# Patient Record
Sex: Female | Born: 1955 | Race: White | Hispanic: No | Marital: Single
Health system: Southern US, Community
[De-identification: ages and names within clinical notes are randomized; demographics above are authoritative.]

---

## 2009-05-21 ENCOUNTER — Inpatient Hospital Stay: Payer: Self-pay | Admitting: Internal Medicine

## 2010-07-10 ENCOUNTER — Observation Stay: Payer: Self-pay | Admitting: Internal Medicine

## 2011-04-28 IMAGING — CR DG CHEST 2V
1 series · 2 of 2 positions shown · non-contrast
Comparison: none

REASON FOR EXAM: SOB
COMMENTS:

PROCEDURE:     DXR - DXR CHEST PA (OR AP) AND LATERAL  - May 20, 2009  [DATE]
RESULT:     The lungs are clear. The cardiac silhouette and visualized bony
skeleton are unremarkable.

[Series 1: view not recorded · 0.17mm/px · 2 of 2 slices shown]
[im 1/2]
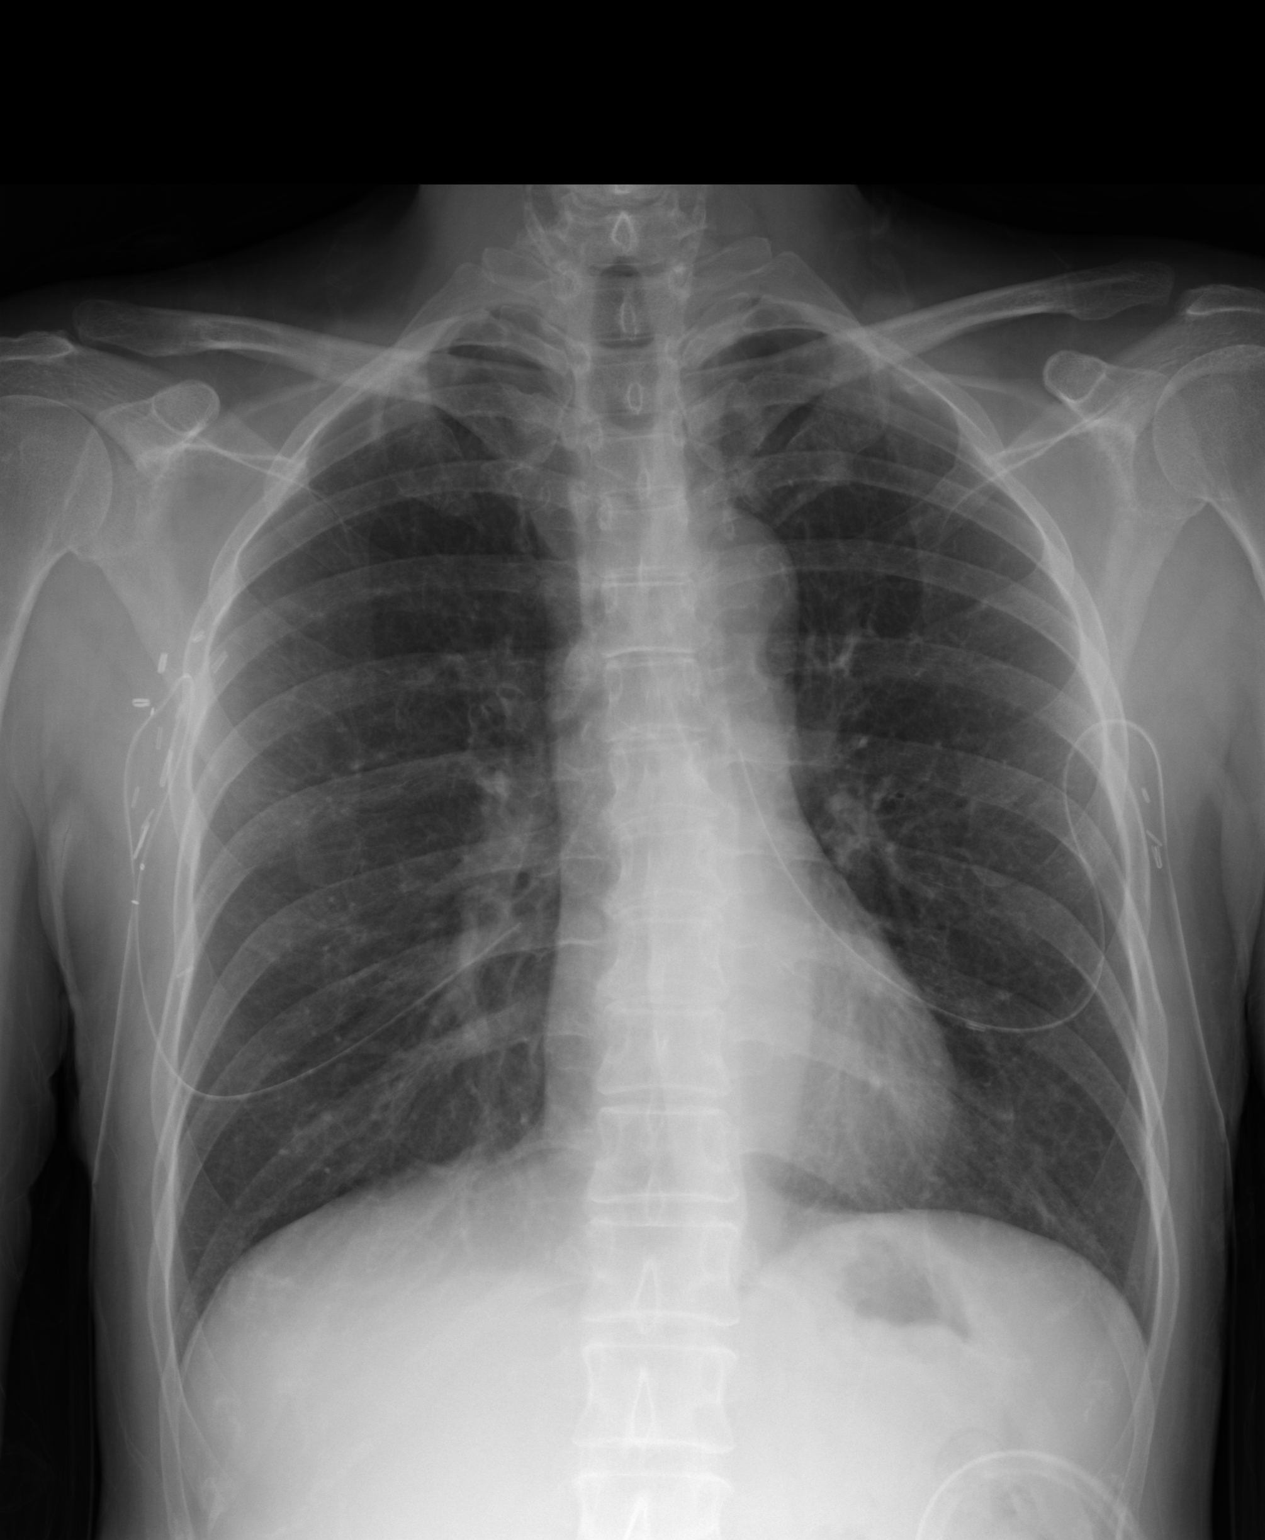
[im 2/2]
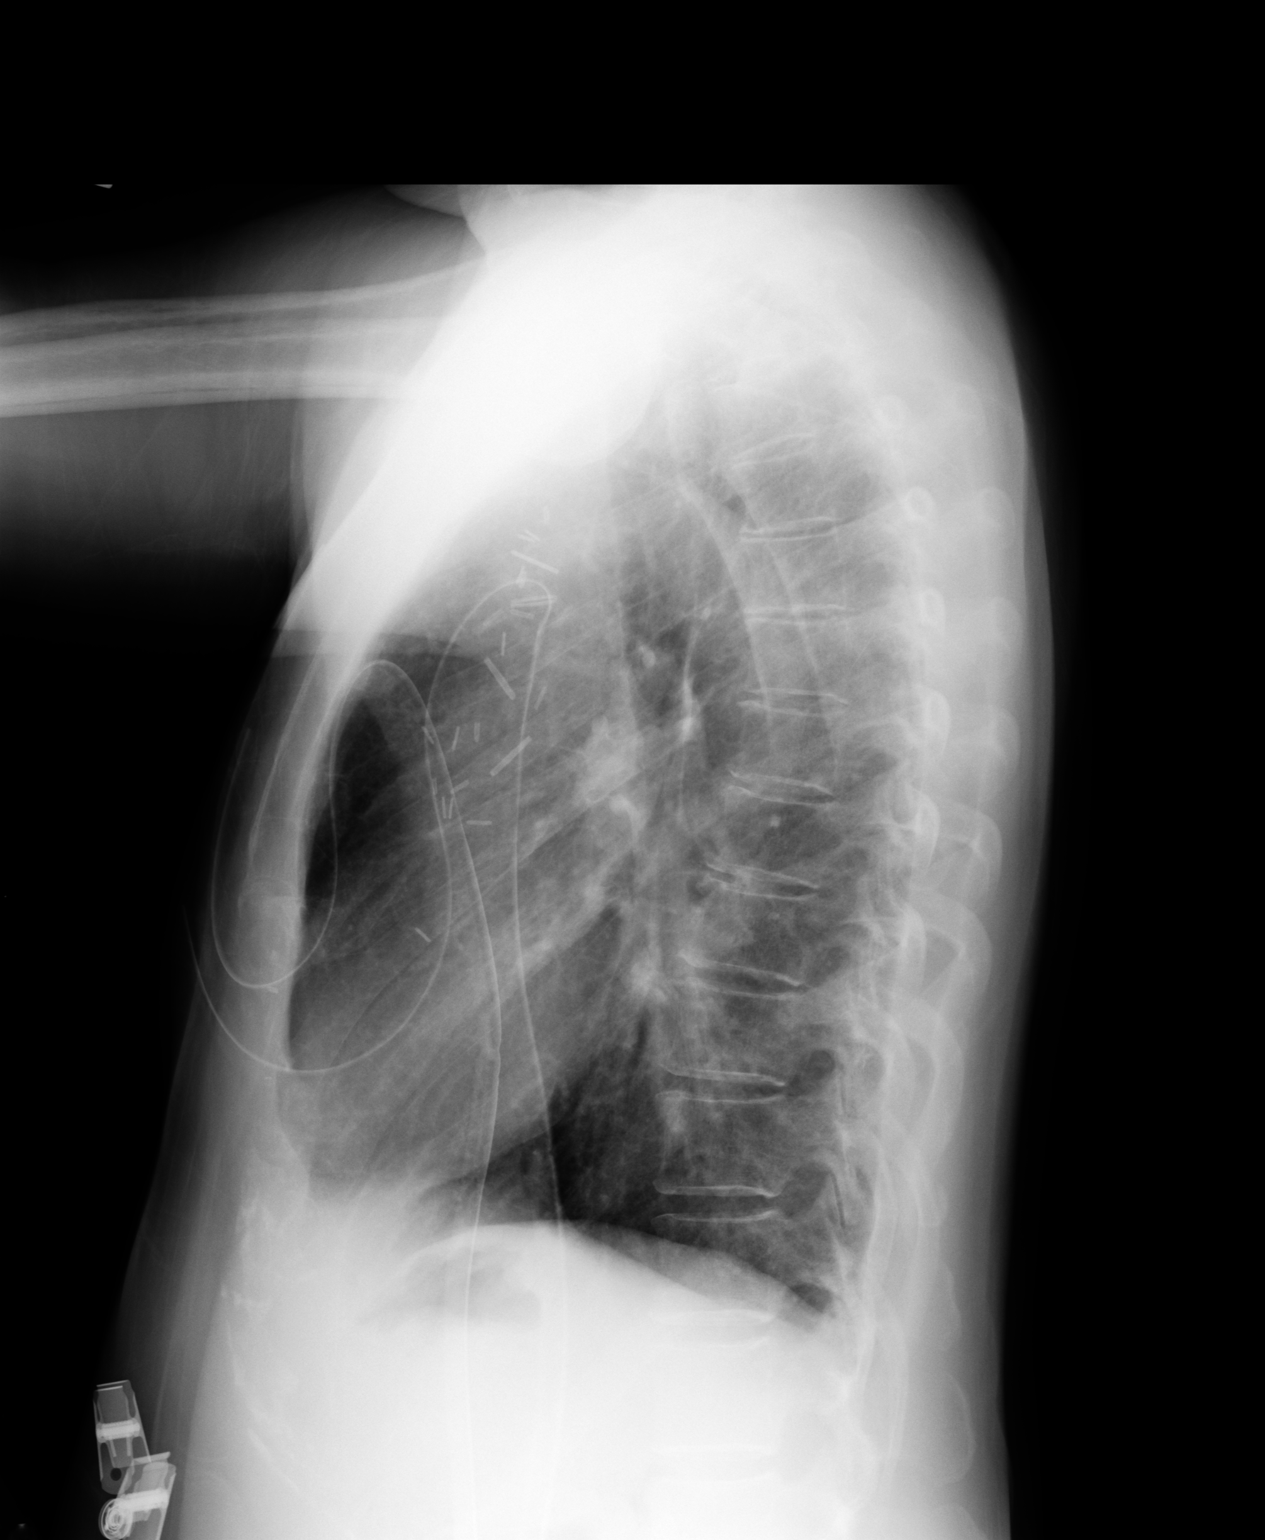

[2 of 2 positions shown; findings below may reference images not displayed]

IMPRESSION: 1. Chest radiograph without evidence of acute cardiopulmonary disease.

## 2011-08-22 ENCOUNTER — Emergency Department: Payer: Self-pay | Admitting: *Deleted

## 2011-08-22 LAB — COMPREHENSIVE METABOLIC PANEL
Anion Gap: 7 (ref 7–16)
BUN: 7 mg/dL (ref 7–18)
Calcium, Total: 9 mg/dL (ref 8.5–10.1)
Chloride: 104 mmol/L (ref 98–107)
Creatinine: 0.96 mg/dL (ref 0.60–1.30)
EGFR (Non-African Amer.): >60
Osmolality: 280 (ref 275–301)
SGOT(AST): 27 U/L (ref 15–37)

## 2011-08-22 LAB — CBC
HCT: 37 % (ref 35.0–47.0)
MCHC: 34.4 g/dL (ref 32.0–36.0)
MCV: 89 fL (ref 80–100)
Platelet: 140 10*3/uL — ABNORMAL LOW (ref 150–440)
RBC: 4.16 10*6/uL (ref 3.80–5.20)
RDW: 12.7 % (ref 11.5–14.5)
WBC: 5.5 10*3/uL (ref 3.6–11.0)

## 2011-08-23 LAB — DRUG SCREEN, URINE

## 2011-08-23 LAB — URINALYSIS, COMPLETE
Blood: NEGATIVE
Glucose,UR: NEGATIVE mg/dL (ref 0–75)
Nitrite: NEGATIVE
Ph: 5 (ref 4.5–8.0)
Protein: NEGATIVE
RBC,UR: 1 /HPF (ref 0–5)
Specific Gravity: 1.017 (ref 1.003–1.030)
Squamous Epithelial: <1
WBC UR: 12 /HPF (ref 0–5)

## 2011-08-23 LAB — TROPONIN I: Troponin-I: <0.02 ng/mL

## 2011-08-23 LAB — CK TOTAL AND CKMB (NOT AT ARMC): CK, Total: 64 U/L (ref 21–215)

## 2012-06-17 IMAGING — CR DG CHEST 2V
1 series · 2 of 2 positions shown · non-contrast
Comparison: none

REASON FOR EXAM: chest pain
COMMENTS:

PROCEDURE:     DXR - DXR CHEST PA (OR AP) AND LATERAL  - July 10, 2010 [DATE]
RESULT:     The lungs are clear. The cardiac silhouette and visualized bony
skeleton are unremarkable.

[Series 1: view not recorded · 0.17mm/px · 2 of 2 slices shown]
[im 1/2]
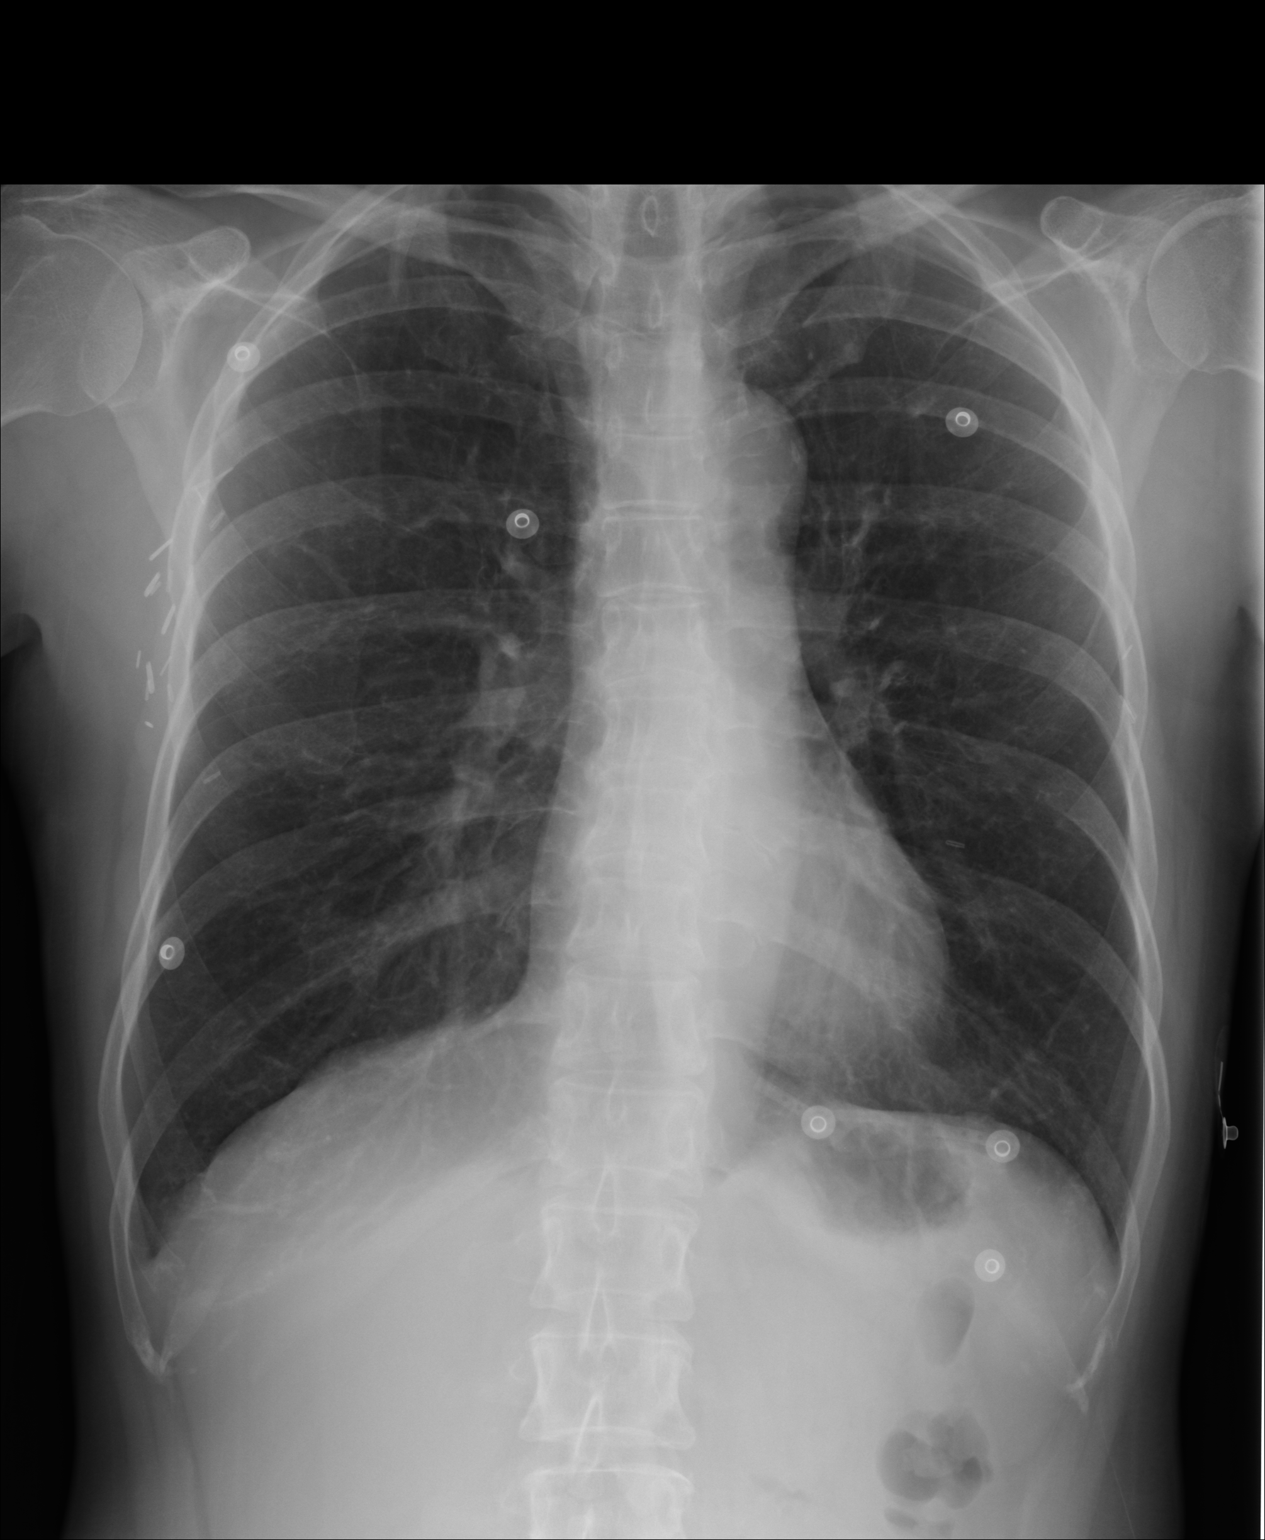
[im 2/2]
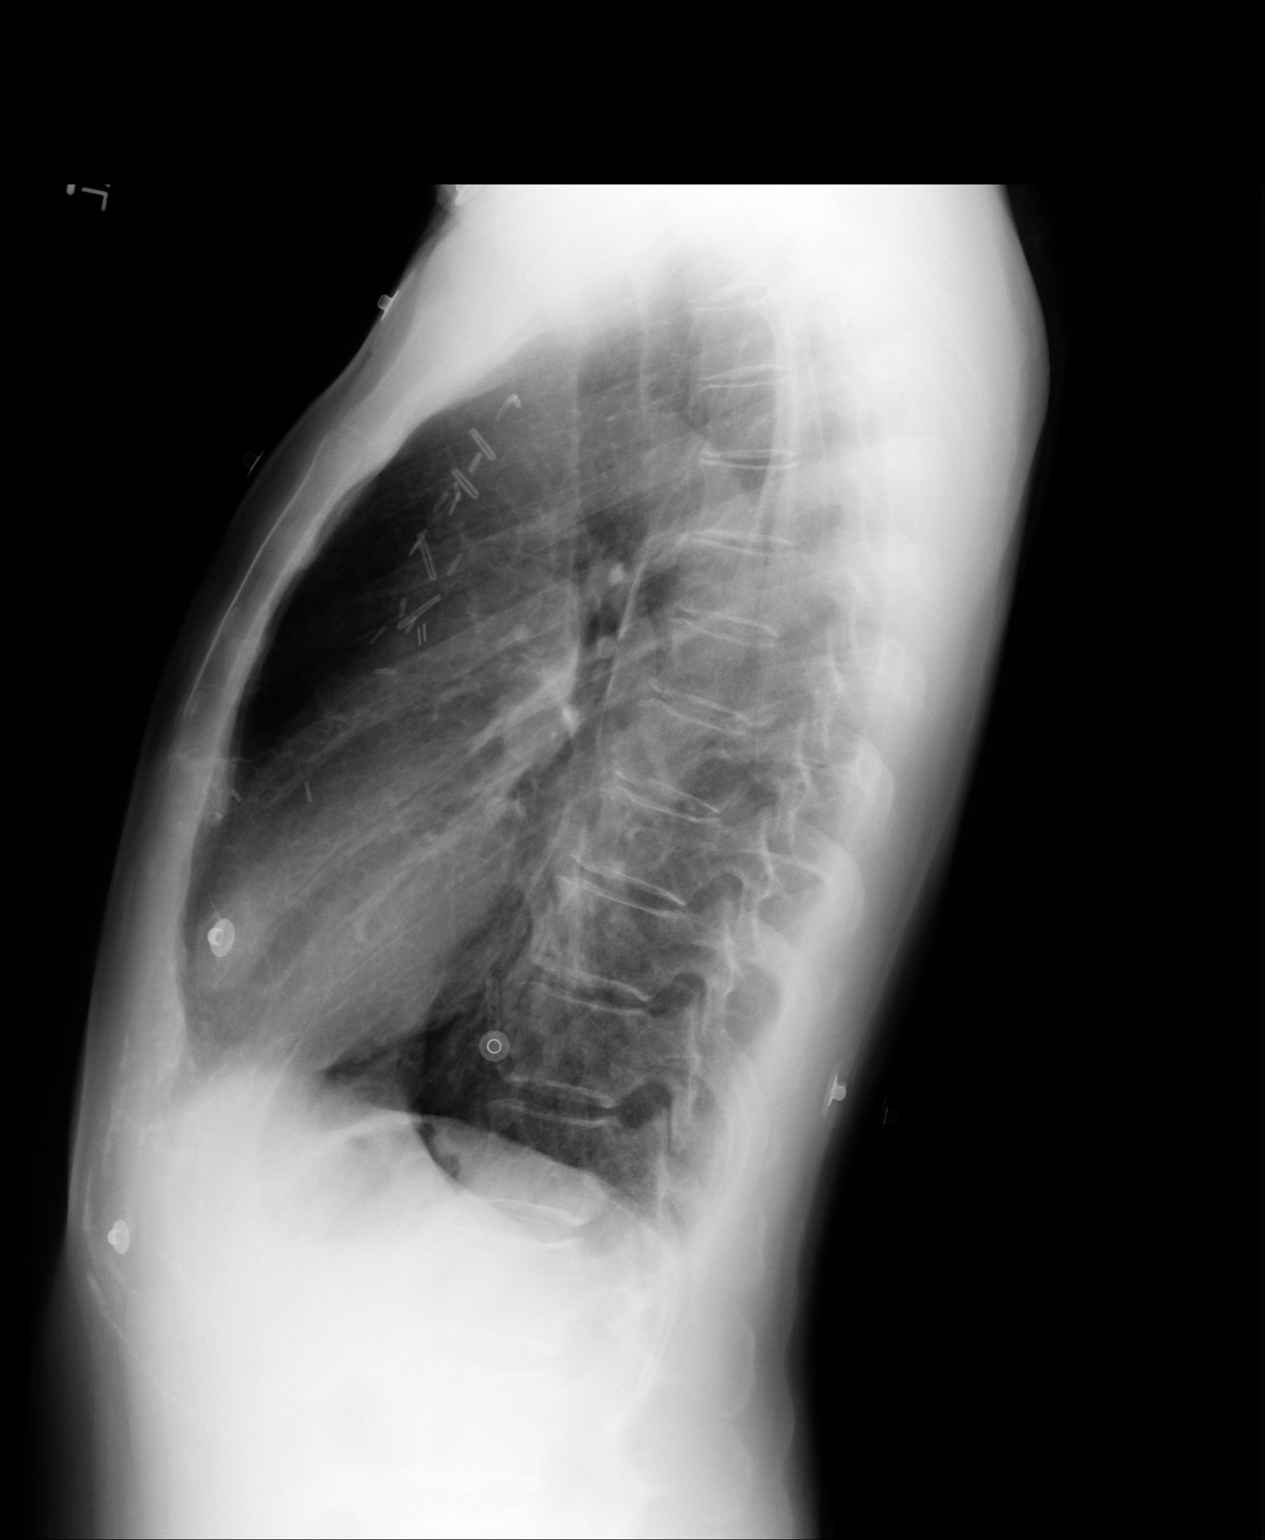

[2 of 2 positions shown; findings below may reference images not displayed]

IMPRESSION: 1. Chest radiograph without evidence of acute cardiopulmonary disease.
2. Comparison made to prior study dated 05/20/2009.
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
## 2019-02-27 DEATH — deceased
